# Patient Record
Sex: Male | Born: 1990 | Race: White | Hispanic: No | Marital: Single | Smoking: Never smoker
Health system: Southern US, Community
[De-identification: ages and names within clinical notes are randomized; demographics above are authoritative.]

## PROBLEM LIST (undated history)

## (undated) DIAGNOSIS — M419 Scoliosis, unspecified: Secondary | ICD-10-CM

## (undated) HISTORY — PX: BACK SURGERY: SHX140

## (undated) HISTORY — PX: HAND SURGERY: SHX662

---

## 2018-08-22 DIAGNOSIS — H16041 Marginal corneal ulcer, right eye: Secondary | ICD-10-CM | POA: Diagnosis not present

## 2018-08-25 DIAGNOSIS — H16041 Marginal corneal ulcer, right eye: Secondary | ICD-10-CM | POA: Diagnosis not present

## 2019-01-08 DIAGNOSIS — Z20828 Contact with and (suspected) exposure to other viral communicable diseases: Secondary | ICD-10-CM | POA: Diagnosis not present

## 2019-03-03 ENCOUNTER — Other Ambulatory Visit: Payer: Self-pay

## 2019-03-03 DIAGNOSIS — Z20822 Contact with and (suspected) exposure to covid-19: Secondary | ICD-10-CM

## 2019-03-05 LAB — NOVEL CORONAVIRUS, NAA: SARS-CoV-2, NAA: NOT DETECTED

## 2020-01-12 ENCOUNTER — Other Ambulatory Visit: Payer: Self-pay

## 2020-01-12 ENCOUNTER — Encounter (HOSPITAL_COMMUNITY): Payer: Self-pay

## 2020-01-12 ENCOUNTER — Emergency Department (HOSPITAL_COMMUNITY)
Admission: EM | Admit: 2020-01-12 | Discharge: 2020-01-13 | Disposition: A | Discharge status: Home / Self Care | Payer: BC Managed Care – PPO | Attending: Emergency Medicine | Admitting: Emergency Medicine

## 2020-01-12 DIAGNOSIS — Z20822 Contact with and (suspected) exposure to covid-19: Secondary | ICD-10-CM | POA: Diagnosis not present

## 2020-01-12 DIAGNOSIS — S065X9A Traumatic subdural hemorrhage with loss of consciousness of unspecified duration, initial encounter: Secondary | ICD-10-CM | POA: Insufficient documentation

## 2020-01-12 DIAGNOSIS — W133XXA Fall through floor, initial encounter: Secondary | ICD-10-CM | POA: Diagnosis not present

## 2020-01-12 DIAGNOSIS — S0219XA Other fracture of base of skull, initial encounter for closed fracture: Secondary | ICD-10-CM

## 2020-01-12 DIAGNOSIS — S0990XA Unspecified injury of head, initial encounter: Secondary | ICD-10-CM | POA: Diagnosis present

## 2020-01-12 DIAGNOSIS — S065XAA Traumatic subdural hemorrhage with loss of consciousness status unknown, initial encounter: Secondary | ICD-10-CM

## 2020-01-12 DIAGNOSIS — S0921XA Traumatic rupture of right ear drum, initial encounter: Secondary | ICD-10-CM | POA: Diagnosis not present

## 2020-01-12 DIAGNOSIS — S0281XA Fracture of other specified skull and facial bones, right side, initial encounter for closed fracture: Secondary | ICD-10-CM | POA: Diagnosis not present

## 2020-01-12 DIAGNOSIS — Y9301 Activity, walking, marching and hiking: Secondary | ICD-10-CM | POA: Insufficient documentation

## 2020-01-12 DIAGNOSIS — R112 Nausea with vomiting, unspecified: Secondary | ICD-10-CM | POA: Diagnosis not present

## 2020-01-12 DIAGNOSIS — S06369A Traumatic hemorrhage of cerebrum, unspecified, with loss of consciousness of unspecified duration, initial encounter: Secondary | ICD-10-CM | POA: Diagnosis not present

## 2020-01-12 DIAGNOSIS — G9389 Other specified disorders of brain: Secondary | ICD-10-CM

## 2020-01-12 DIAGNOSIS — Y92090 Kitchen in other non-institutional residence as the place of occurrence of the external cause: Secondary | ICD-10-CM | POA: Insufficient documentation

## 2020-01-12 HISTORY — DX: Scoliosis, unspecified: M41.9

## 2020-01-12 NOTE — ED Notes (Signed)
Andre Newman gf [phone]

## 2020-01-12 NOTE — ED Triage Notes (Signed)
Pt coming with EMS from home where it was reported that pt was watching tv, had sudden onset nausea and not feeling well. Pt walked to kitchen, vomited, and fell to the floor. Not witnessed; pt loss consciousness and gf states pt was confused. Pt was found to be bleeding from RT ear with clear liquid in a halo around pool of blood. Pt has -spinal tenderness; in c-collar for safety. Pt has ETOH on board.

## 2020-01-13 ENCOUNTER — Telehealth (HOSPITAL_COMMUNITY): Payer: Self-pay | Admitting: Emergency Medicine

## 2020-01-13 ENCOUNTER — Emergency Department (HOSPITAL_COMMUNITY): Payer: BC Managed Care – PPO

## 2020-01-13 LAB — CBC WITH DIFFERENTIAL/PLATELET
Abs Immature Granulocytes: 0.04 10*3/uL (ref 0.00–0.07)
Basophils Absolute: 0 10*3/uL (ref 0.0–0.1)
Basophils Relative: 1 %
Eosinophils Absolute: 0.3 10*3/uL (ref 0.0–0.5)
Eosinophils Relative: 5 %
HCT: 42.9 % (ref 39.0–52.0)
Hemoglobin: 14.8 g/dL (ref 13.0–17.0)
Immature Granulocytes: 1 %
Lymphocytes Relative: 35 %
Lymphs Abs: 2.1 10*3/uL (ref 0.7–4.0)
MCH: 32.2 pg (ref 26.0–34.0)
MCHC: 34.5 g/dL (ref 30.0–36.0)
MCV: 93.3 fL (ref 80.0–100.0)
Monocytes Absolute: 0.6 10*3/uL (ref 0.1–1.0)
Monocytes Relative: 10 %
Neutro Abs: 2.9 10*3/uL (ref 1.7–7.7)
Neutrophils Relative %: 48 %
Platelets: 239 10*3/uL (ref 150–400)
RBC: 4.6 MIL/uL (ref 4.22–5.81)
RDW: 11.8 % (ref 11.5–15.5)
WBC: 6 10*3/uL (ref 4.0–10.5)
nRBC: 0 % (ref 0.0–0.2)

## 2020-01-13 LAB — RESPIRATORY PANEL BY RT PCR (FLU A&B, COVID)
Influenza A by PCR: NEGATIVE
Influenza B by PCR: NEGATIVE
SARS Coronavirus 2 by RT PCR: NEGATIVE

## 2020-01-13 LAB — BASIC METABOLIC PANEL
Anion gap: 13 (ref 5–15)
BUN: 18 mg/dL (ref 6–20)
CO2: 23 mmol/L (ref 22–32)
Calcium: 9 mg/dL (ref 8.9–10.3)
Chloride: 105 mmol/L (ref 98–111)
Creatinine, Ser: 0.96 mg/dL (ref 0.61–1.24)
GFR calc Af Amer: >60 mL/min (ref >60)
GFR calc non Af Amer: >60 mL/min (ref >60)
Glucose, Bld: 106 mg/dL — ABNORMAL HIGH (ref 70–99)
Potassium: 3.3 mmol/L — ABNORMAL LOW (ref 3.5–5.1)
Sodium: 141 mmol/L (ref 135–145)

## 2020-01-13 MED ORDER — HYDROMORPHONE HCL 1 MG/ML IJ SOLN
0.5000 mg | Freq: Once | INTRAMUSCULAR | Status: AC
  Administered 2020-01-13: 0.5 mg via INTRAVENOUS
  Filled 2020-01-13: qty 1

## 2020-01-13 MED ORDER — ONDANSETRON 4 MG PO TBDP: 4.0000 mg | ORAL_TABLET | Freq: Three times a day (TID) | ORAL | 0 refills | Status: DC | PRN

## 2020-01-13 MED ORDER — CEFAZOLIN SODIUM-DEXTROSE 1-4 GM/50ML-% IV SOLN
1.0000 g | Freq: Once | INTRAVENOUS | Status: AC
  Administered 2020-01-13: 1 g via INTRAVENOUS
  Filled 2020-01-13: qty 50

## 2020-01-13 MED ORDER — ACETAMINOPHEN 500 MG PO TABS: 1000.0000 mg | ORAL_TABLET | Freq: Three times a day (TID) | ORAL | 0 refills | Status: DC

## 2020-01-13 MED ORDER — SODIUM CHLORIDE 0.9 % IV BOLUS (SEPSIS)
1000.0000 mL | Freq: Once | INTRAVENOUS | Status: AC
  Administered 2020-01-13: 1000 mL via INTRAVENOUS

## 2020-01-13 MED ORDER — ONDANSETRON 4 MG PO TBDP: 4.0000 mg | ORAL_TABLET | Freq: Three times a day (TID) | ORAL | 0 refills | Status: AC | PRN

## 2020-01-13 MED ORDER — IOHEXOL 350 MG/ML SOLN
100.0000 mL | Freq: Once | INTRAVENOUS | Status: AC | PRN
  Administered 2020-01-13: 100 mL via INTRAVENOUS

## 2020-01-13 MED ORDER — HYDROCODONE-ACETAMINOPHEN 5-325 MG PO TABS: 1.0000 | ORAL_TABLET | Freq: Three times a day (TID) | ORAL | 0 refills | Status: DC | PRN

## 2020-01-13 MED ORDER — SODIUM CHLORIDE 0.9 % IV SOLN
1000.0000 mL | INTRAVENOUS | Status: DC
  Administered 2020-01-13: 1000 mL via INTRAVENOUS

## 2020-01-13 MED ORDER — ACETAMINOPHEN 500 MG PO TABS: 1000.0000 mg | ORAL_TABLET | Freq: Three times a day (TID) | ORAL | 0 refills | Status: AC

## 2020-01-13 NOTE — ED Provider Notes (Signed)
[facility] Provider Note  CSN: [id number] Arrival date & time: 01/12/20 [id number]  Chief Complaint(s) Fall  HPI Andre Newman is a 29 y.o. male who presents to the emergency department after fall while intoxicated resulting in head trauma.  Patient does not remember the cause of the fall.  Bystanders reported to EMS that patient reported feeling suddenly nauseous and as he was walking to the kitchen, he vomited and fell to the floor.  The fall was not witnessed.  There was positive loss of consciousness.  Patient was reportedly confused after the fall.  Found to be bleeding from the right ear.  Patient endorses drinking whiskey with friends today.  Denies daily alcohol consumption.  He is alert and oriented x3.  Denies any headache, neck pain, back pain, chest pain, abdominal pain, extremity pain.  HPI  Past Medical History Past Medical History:  Diagnosis Date  . Scoliosis    There are no problems to display for this patient.  Home Medication(s) Prior to Admission medications   Not on File                                                                                                                                    Past Surgical History Past Surgical History:  Procedure Laterality Date  . BACK SURGERY    . HAND SURGERY Bilateral    Family History History reviewed. No pertinent family history.  Social History Social History   Tobacco Use  . Smoking status: Never Smoker  Substance Use Topics  . Alcohol use: Yes  . Drug use: Never   Allergies Nickel  Review of Systems Review of Systems All other systems are reviewed and are negative for acute change except as noted in the HPI  Physical Exam Vital Signs  I have reviewed the triage vital signs BP (!) 146/93 (BP Location: Right Arm)   Pulse 86   Temp (!) 97.5 F (36.4 C) (Oral)   Resp (!) 27   Ht 5\' 7"  (1.702 m)   Wt 74.8 kg   SpO2 99%   BMI 25.84 kg/m   Physical  Exam Constitutional:      General: He is not in acute distress.    Appearance: He is well-developed. He is not diaphoretic.  HENT:     Head: Normocephalic.     Right Ear: Hearing normal.     Left Ear: Hearing and external ear normal.     Ears:     Comments: Blood in the right external ear canal with swelling and likely hematoma at 6-8 o'clock area. TM appears irregular, possible rupture. No hemotympanum.  Left TM has webbed pattern, but appears intact. No bleeding Eyes:     General: No scleral icterus.       Right eye: No discharge.        Left eye: No discharge.     Conjunctiva/sclera: Conjunctivae normal.  Pupils: Pupils are equal, round, and reactive to light.  Cardiovascular:     Rate and Rhythm: Regular rhythm.     Pulses:          Radial pulses are 2+ on the right side and 2+ on the left side.       Dorsalis pedis pulses are 2+ on the right side and 2+ on the left side.     Heart sounds: Normal heart sounds. No murmur heard.  No friction rub. No gallop.   Pulmonary:     Effort: Pulmonary effort is normal. No respiratory distress.     Breath sounds: Normal breath sounds. No stridor.  Abdominal:     General: There is no distension.     Palpations: Abdomen is soft.     Tenderness: There is no abdominal tenderness.  Musculoskeletal:     Cervical back: Normal range of motion and neck supple. No bony tenderness. No spinous process tenderness or muscular tenderness.     Thoracic back: No bony tenderness.     Lumbar back: No bony tenderness.     Comments: Clavicle stable. Chest stable to AP/Lat compression. Pelvis stable to Lat compression. Congenital ectrodactyly No chest or abdominal wall contusion.  Skin:    General: Skin is warm.  Neurological:     Mental Status: He is alert and oriented to person, place, and time.     GCS: GCS eye subscore is 4. GCS verbal subscore is 5. GCS motor subscore is 6.     Comments: Moving all extremities      ED Results and  Treatments Labs (all labs ordered are listed, but only abnormal results are displayed) Labs Reviewed  BASIC METABOLIC PANEL - Abnormal; Notable for the following components:      Result Value   Potassium 3.3 (*)    Glucose, Bld 106 (*)    All other components within normal limits  RESPIRATORY PANEL BY RT PCR (FLU A&B, COVID)  CBC WITH DIFFERENTIAL/PLATELET                                                                                                                         EKG  EKG Interpretation  Date/Time:    Ventricular Rate:    PR Interval:    QRS Duration:   QT Interval:    QTC Calculation:   R Axis:     Text Interpretation:        Radiology CT Angio Head W or Wo Contrast  Result Date: 01/13/2020 CLINICAL DATA:  Skull base fracture EXAM: CT ANGIOGRAPHY HEAD AND NECK TECHNIQUE: Multidetector CT imaging of the head and neck was performed using the standard protocol during bolus administration of intravenous contrast. Multiplanar CT image reconstructions and MIPs were obtained to evaluate the vascular anatomy. Carotid stenosis measurements (when applicable) are obtained utilizing NASCET criteria, using the distal internal carotid diameter as the denominator. CONTRAST:  OMNIPAQUE IOHEXOL 350 MG/ML SOLN COMPARISON:  None. FINDINGS: CTA NECK FINDINGS  SKELETON: Right occipital and temporal bone fractures. OTHER NECK: Normal pharynx, larynx and major salivary glands. No cervical lymphadenopathy. Unremarkable thyroid gland. UPPER CHEST: No pneumothorax or pleural effusion. No nodules or masses. AORTIC ARCH: There is no calcific atherosclerosis of the aortic arch. There is no aneurysm, dissection or hemodynamically significant stenosis of the visualized portion of the aorta. Conventional 3 vessel aortic branching pattern. The visualized proximal subclavian arteries are widely patent. RIGHT CAROTID SYSTEM: Normal without aneurysm, dissection or stenosis. LEFT CAROTID SYSTEM: Normal  without aneurysm, dissection or stenosis. VERTEBRAL ARTERIES: Codominant configuration. Both origins are clearly patent. There is no dissection, occlusion or flow-limiting stenosis to the skull base (V1-V3 segments). CTA HEAD FINDINGS POSTERIOR CIRCULATION: --Vertebral arteries: Normal V4 segments. --Inferior cerebellar arteries: Normal. --Basilar artery: Normal. --Superior cerebellar arteries: Normal. --Posterior cerebral arteries (PCA): Normal. ANTERIOR CIRCULATION: --Intracranial internal carotid arteries: Normal. --Anterior cerebral arteries (ACA): Normal. Both A1 segments are present. Patent anterior communicating artery (a-comm). --Middle cerebral arteries (MCA): Normal. VENOUS SINUSES: As permitted by contrast timing, patent. ANATOMIC VARIANTS: None Review of the MIP images confirms the above findings. IMPRESSION: No blunt cerebrovascular injury. The right internal carotid artery and right internal jugular vein are patent and of consistent caliber at the site of the right temporal bone fracture. Electronically Signed   By: Deatra Robinson M.D.   On: 01/13/2020 02:14   CT Head Wo Contrast  Result Date: 01/13/2020 CLINICAL DATA:  Head trauma. EXAM: CT HEAD AND TEMPORAL BONES WITHOUT CONTRAST TECHNIQUE: Contiguous axial images were obtained from the base of the skull through the vertex without contrast. Multidetector CT imaging of the temporal bones was performed using the standard protocol without intravenous contrast. COMPARISON:  None. FINDINGS: CT HEAD FINDINGS Brain: Small volume pneumocephalus, most notable along the anterior interhemispheric fissure. There is a small parafalcine subdural hematoma. Small amount of subarachnoid blood at the anterior right convexity. There is also a small subdural hematoma at the lateral right convexity. No midline shift or other mass effect. Vascular: No abnormal hyperdensity of the major intracranial arteries or dural venous sinuses. No intracranial atherosclerosis.  Skull: There is a right paramedian occipital bone fracture that extends to the right petrous apex, detailed below. Sinuses/Orbits: No fluid levels or advanced mucosal thickening of the visualized paranasal sinuses. The orbits are normal. CT TEMPORAL BONES FINDINGS There is blood in the middle ear cavity. No ossicular dislocation. There is a fracture of the right temporal bone that passes medial to the internal auditory canal and cochlea. The fracture traverses the carotid canal, jugular foramen and hypoglossal canal. The mastoid air cells are clear. IMPRESSION: 1. Small parafalcine and lateral right convexity subdural hematomas without midline shift or other mass effect. 2. Right temporal bone fracture that spares the otic capsule, but traverses the carotid canal, jugular foramen and hypoglossal canal. CTA of the head is recommended to assess vascular patency. 3. Small volume pneumocephalus, most notable along the anterior interhemispheric fissure. Critical Value/emergent results were called by telephone at the time of interpretation on 01/13/2020 at 1:23 am to provider [facility] , who verbally acknowledged these results. Electronically Signed   By: Deatra Robinson M.D.   On: 01/13/2020 01:22   CT Angio Neck W and/or Wo Contrast  Result Date: 01/13/2020 CLINICAL DATA:  Skull base fracture EXAM: CT ANGIOGRAPHY HEAD AND NECK TECHNIQUE: Multidetector CT imaging of the head and neck was performed using the standard protocol during bolus administration of intravenous contrast. Multiplanar CT image reconstructions and MIPs were obtained to  evaluate the vascular anatomy. Carotid stenosis measurements (when applicable) are obtained utilizing NASCET criteria, using the distal internal carotid diameter as the denominator. CONTRAST:  OMNIPAQUE IOHEXOL 350 MG/ML SOLN COMPARISON:  None. FINDINGS: CTA NECK FINDINGS SKELETON: Right occipital and temporal bone fractures. OTHER NECK: Normal pharynx, larynx and major  salivary glands. No cervical lymphadenopathy. Unremarkable thyroid gland. UPPER CHEST: No pneumothorax or pleural effusion. No nodules or masses. AORTIC ARCH: There is no calcific atherosclerosis of the aortic arch. There is no aneurysm, dissection or hemodynamically significant stenosis of the visualized portion of the aorta. Conventional 3 vessel aortic branching pattern. The visualized proximal subclavian arteries are widely patent. RIGHT CAROTID SYSTEM: Normal without aneurysm, dissection or stenosis. LEFT CAROTID SYSTEM: Normal without aneurysm, dissection or stenosis. VERTEBRAL ARTERIES: Codominant configuration. Both origins are clearly patent. There is no dissection, occlusion or flow-limiting stenosis to the skull base (V1-V3 segments). CTA HEAD FINDINGS POSTERIOR CIRCULATION: --Vertebral arteries: Normal V4 segments. --Inferior cerebellar arteries: Normal. --Basilar artery: Normal. --Superior cerebellar arteries: Normal. --Posterior cerebral arteries (PCA): Normal. ANTERIOR CIRCULATION: --Intracranial internal carotid arteries: Normal. --Anterior cerebral arteries (ACA): Normal. Both A1 segments are present. Patent anterior communicating artery (a-comm). --Middle cerebral arteries (MCA): Normal. VENOUS SINUSES: As permitted by contrast timing, patent. ANATOMIC VARIANTS: None Review of the MIP images confirms the above findings. IMPRESSION: No blunt cerebrovascular injury. The right internal carotid artery and right internal jugular vein are patent and of consistent caliber at the site of the right temporal bone fracture. Electronically Signed   By: Deatra Robinson M.D.   On: 01/13/2020 02:14   CT Cervical Spine Wo Contrast  Result Date: 01/13/2020 CLINICAL DATA:  Neck trauma, to EXAM: CT CERVICAL SPINE WITHOUT CONTRAST TECHNIQUE: Multidetector CT imaging of the cervical spine was performed without intravenous contrast. Multiplanar CT image reconstructions were also generated. COMPARISON:  None. FINDINGS:  Alignment: Physiologic Skull base and vertebrae: There is a fracture seen through the right occipital bone extending through the occipital condyle and temporal bone as further described dedicated CT. No atlanto-occipital dissociation. The vertebral body heights are well maintained. No fracture or pathologic osseous lesion seen. Soft tissues and spinal canal: The visualized paraspinal soft tissues are unremarkable. No prevertebral soft tissue swelling is seen. The spinal canal is grossly unremarkable, no large epidural collection or significant canal narrowing. Disc levels:    No significant canal or neural foraminal narrowing. Upper chest: The lung apices are clear. Thoracic inlet is within normal limits. Other: None IMPRESSION: Nondisplaced fractures through the right nasal bone and temporal bone and further described on dedicated CT. No acute fracture or malalignment of the cervical spine. Electronically Signed   By: Jonna Clark M.D.   On: 01/13/2020 01:15   CT Temporal Bones Wo Contrast  Result Date: 01/13/2020 CLINICAL DATA:  Head trauma. EXAM: CT HEAD AND TEMPORAL BONES WITHOUT CONTRAST TECHNIQUE: Contiguous axial images were obtained from the base of the skull through the vertex without contrast. Multidetector CT imaging of the temporal bones was performed using the standard protocol without intravenous contrast. COMPARISON:  None. FINDINGS: CT HEAD FINDINGS Brain: Small volume pneumocephalus, most notable along the anterior interhemispheric fissure. There is a small parafalcine subdural hematoma. Small amount of subarachnoid blood at the anterior right convexity. There is also a small subdural hematoma at the lateral right convexity. No midline shift or other mass effect. Vascular: No abnormal hyperdensity of the major intracranial arteries or dural venous sinuses. No intracranial atherosclerosis. Skull: There is a right paramedian occipital bone  fracture that extends to the right petrous apex, detailed  below. Sinuses/Orbits: No fluid levels or advanced mucosal thickening of the visualized paranasal sinuses. The orbits are normal. CT TEMPORAL BONES FINDINGS There is blood in the middle ear cavity. No ossicular dislocation. There is a fracture of the right temporal bone that passes medial to the internal auditory canal and cochlea. The fracture traverses the carotid canal, jugular foramen and hypoglossal canal. The mastoid air cells are clear. IMPRESSION: 1. Small parafalcine and lateral right convexity subdural hematomas without midline shift or other mass effect. 2. Right temporal bone fracture that spares the otic capsule, but traverses the carotid canal, jugular foramen and hypoglossal canal. CTA of the head is recommended to assess vascular patency. 3. Small volume pneumocephalus, most notable along the anterior interhemispheric fissure. Critical Value/emergent results were called by telephone at the time of interpretation on 01/13/2020 at 1:23 am to provider [facility] , who verbally acknowledged these results. Electronically Signed   By: Deatra Robinson M.D.   On: 01/13/2020 01:22    Pertinent labs & imaging results that were available during my care of the patient were reviewed by me and considered in my medical decision making (see chart for details).  Medications Ordered in ED Medications  sodium chloride 0.9 % bolus 1,000 mL (0 mLs Intravenous Stopped 01/13/20 0116)    Followed by  0.9 %  sodium chloride infusion (1,000 mLs Intravenous New Bag/Given 01/13/20 0221)  ceFAZolin (ANCEF) IVPB 1 g/50 mL premix (0 g Intravenous Stopped 01/13/20 0228)  HYDROmorphone (DILAUDID) injection 0.5 mg (0.5 mg Intravenous Given 01/13/20 0154)  iohexol (OMNIPAQUE) 350 MG/ML injection 100 mL (100 mLs Intravenous Contrast Given 01/13/20 0153)                                                                                                                                    Procedures .Critical Care Performed by:  Nira Conn, MD Authorized by: Nira Conn, MD    CRITICAL CARE Performed by: Amadeo Garnet Lamont Glasscock Total critical care time: 60 minutes Critical care time was exclusive of separately billable procedures and treating other patients. Critical care was necessary to treat or prevent imminent or life-threatening deterioration. Critical care was time spent personally by me on the following activities: development of treatment plan with patient and/or surrogate as well as nursing, discussions with consultants, evaluation of patient's response to treatment, examination of patient, obtaining history from patient or surrogate, ordering and performing treatments and interventions, ordering and review of laboratory studies, ordering and review of radiographic studies, pulse oximetry and re-evaluation of patient's condition.   (including critical care time)  Medical Decision Making / ED Course I have reviewed the nursing notes for this encounter and the patient's prior records (if available in EHR or on provided paperwork).   JULLIEN GRANQUIST was evaluated in Emergency Department on 01/13/2020 for the symptoms described in the history of  present illness. He was evaluated in the context of the global COVID-19 pandemic, which necessitated consideration that the patient might be at risk for infection with the SARS-CoV-2 virus that causes COVID-19. Institutional protocols and algorithms that pertain to the evaluation of patients at risk for COVID-19 are in a state of rapid change based on information released by regulatory bodies including the CDC and federal and state organizations. These policies and algorithms were followed during the patient's care in the ED.  Fall at home in the setting of alcohol intoxication.  Patient fell after throwing up.  At time of evaluation the patient is alert and oriented x4.  No focal deficits on exam.  There was suspicion for possible CSF leak reported by  EMS.  No CSF ring noted on my evaluation.  Possible TM perforation on exam  CT of the head, cervical spine and temporal bone obtained revealing 2 small subdural hematomas with small pneumocephalus and a right temporal bone fracture.  Case discussed with neurosurgery who recommended repeat imaging in a few hours.  If stable, patient can be discharged with close follow-up.  Also spoke to Dr. Jearld Fenton from ENT regarding the ear trauma.  He recommended close follow-up in clinic.  No otic or oral antibiotics per ENT.  Keep the ear dry.  Repeat CT stable and reassuring.      Final Clinical Impression(s) / ED Diagnoses Final diagnoses:  SDH (subdural hematoma) (HCC)  Pneumocephalus, traumatic  Closed fracture of temporal bone, initial encounter (HCC)  Traumatic rupture of right ear drum, initial encounter  Injury of head, initial encounter   The patient appears reasonably screened and/or stabilized for discharge and I doubt any other medical condition or other [facility] requiring further screening, evaluation, or treatment in the ED at this time prior to discharge. Safe for discharge with strict return precautions.  Disposition: Discharge  Condition: Good  I have discussed the results, Dx and Tx plan with the patient/family who expressed understanding and agree(s) with the plan. Discharge instructions discussed at length. The patient/family was given strict return precautions who verbalized understanding of the instructions. No further questions at time of discharge.    ED Discharge Orders         Ordered    acetaminophen (TYLENOL) 500 MG tablet  Every 8 hours        01/13/20 0656    HYDROcodone-acetaminophen (NORCO/VICODIN) 5-325 MG tablet  Every 8 hours PRN        01/13/20 0656    ondansetron (ZOFRAN ODT) 4 MG disintegrating tablet  Every 8 hours PRN        01/13/20 0656          [facility] narcotic database reviewed and no active narcotic prescriptions noted.   Follow  Up: Lisbeth Renshaw, MD [address] [place] [address] [phone]  Call  To schedule an appointment for close follow up  Suzanna Obey, MD [address] [place] [address] [phone] or [phone] Call  To schedule an appointment for close follow up  Rosalio Macadamia, MD [address] [place] [address] [place] [address] [phone]  Call  As needed      This chart was dictated using voice recognition software.  Despite best efforts to proofread,  errors can occur which can change the documentation meaning.   Nira Conn, MD 01/13/20 [phone]

## 2020-01-13 NOTE — Progress Notes (Signed)
Neurosurgery progress note    Received called from Dr Eudelia Bunch, EDP [facility], regarding patient.  29 year old male who fell several hours ago. By report, felt nauseated and went to vomit when he fell, etoh on board. Underwent Work up by EDP and found to have a nondisplaced temporal bone fracture as well as right convexity and parafalcine SDHs (no mass effect, MLS). He is Neuro intact. There is no role for neurosurgical intervention. P lan to repeat head CT in several hours. If unchanged, patient can be discharged home.

## 2020-01-13 NOTE — ED Notes (Signed)
Replaced pt C-collar from field to [facility] collar per provider verbal order.

## 2020-01-13 NOTE — Telephone Encounter (Signed)
Patient called and states that pharmacy did not receive medications.  Medications were resent.

## 2020-01-15 ENCOUNTER — Telehealth: Payer: Self-pay | Admitting: Surgery

## 2020-01-15 NOTE — Telephone Encounter (Signed)
ED RNCM received call from patient and wife concerning the pharmacy not receiving patient's prescription for Vicodin, CM reviewed record and called [facility] on [address] they also confirmed they did not received the prescription, CM sent message to EDP and will follow-up tomorrow 10/6.

## 2020-01-16 MED ORDER — HYDROCODONE-ACETAMINOPHEN 5-325 MG PO TABS
1.0000 | ORAL_TABLET | Freq: Three times a day (TID) | ORAL | 0 refills | Status: DC | PRN
Start: 2020-01-16 — End: 2020-01-16

## 2020-01-16 NOTE — Telephone Encounter (Signed)
Was unable to order the Norco through Epic. There was a message regarding the need for additional provider information including address, department, etc. There was no place to enter this information, thus the Rx could not be e-Rx'd.  7:42 AM I called the [facility] on [address] and left a verbal Rx for this medication with administration instruction.  Please have the patient call the pharmacy when it opens to see of the Rx went through.

## 2022-05-08 IMAGING — CT CT TEMPORAL BONES W/O CM
3 of 8 series · 15 of 40 positions shown, 17 images · non-contrast
Comparison: None.

CLINICAL DATA: Head trauma.

EXAM:
CT HEAD AND TEMPORAL BONES WITHOUT CONTRAST
TECHNIQUE: Contiguous axial images were obtained from the base of the skull
through the vertex without contrast. Multidetector CT imaging of the
temporal bones was performed using the standard protocol without
intravenous contrast.

[Series 5: axial mag right · axial · 0.18mm/px · z∈[-180,-102]mm · 7 of 175 slices shown, 9 images]
[im 22/175  brain]
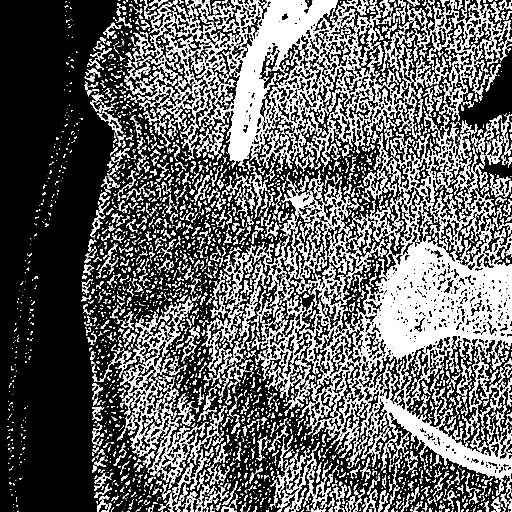
[im 22/175  bone]
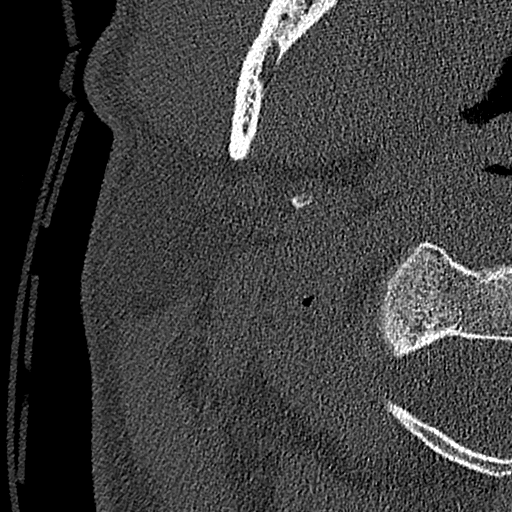
[im 44/175  bone]
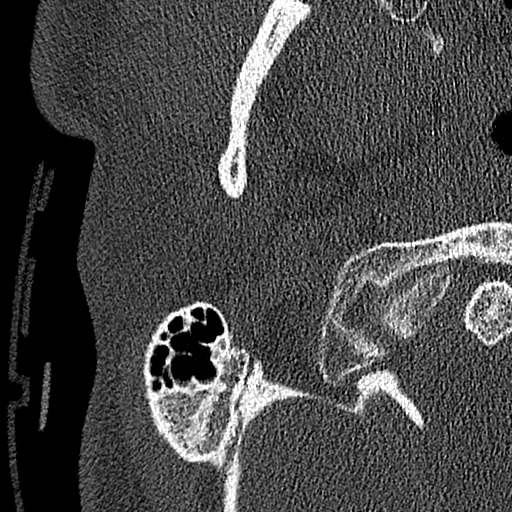
[im 66/175  bone]
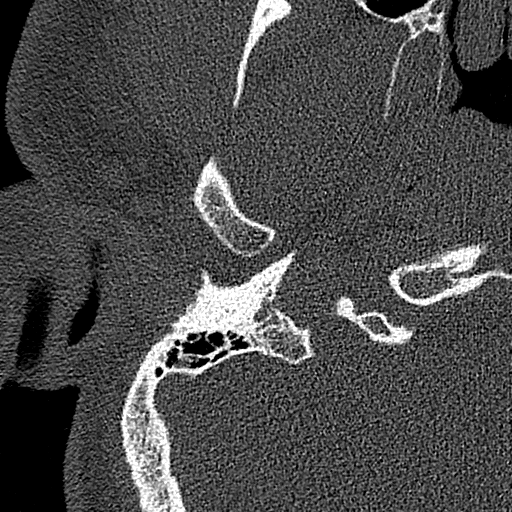
[im 88/175  bone]
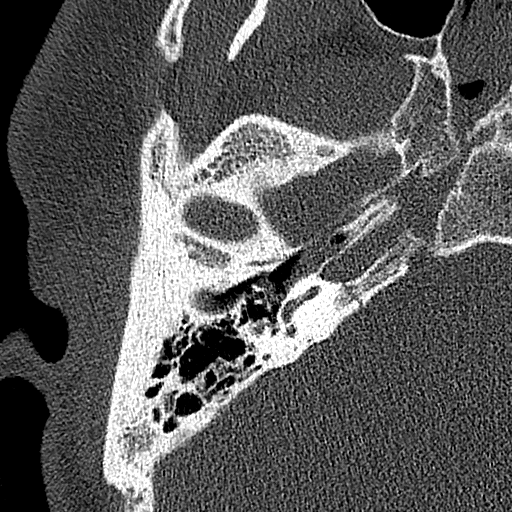
[im 109/175  brain]
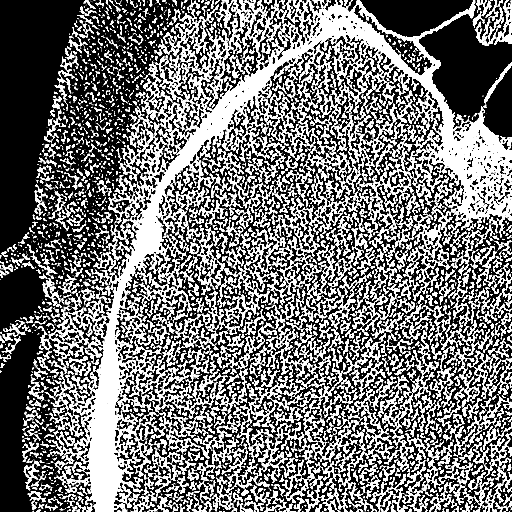
[im 109/175  bone]
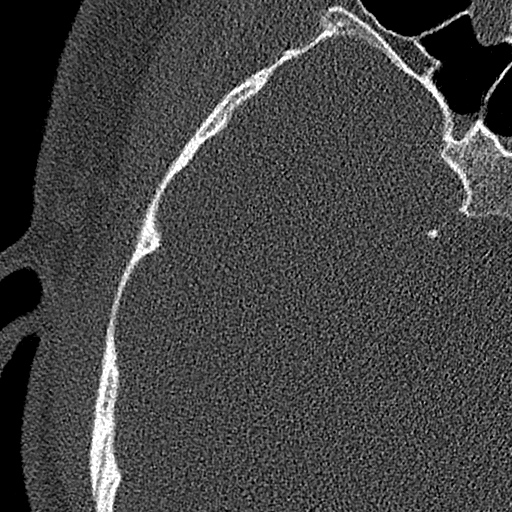
[im 131/175  bone]
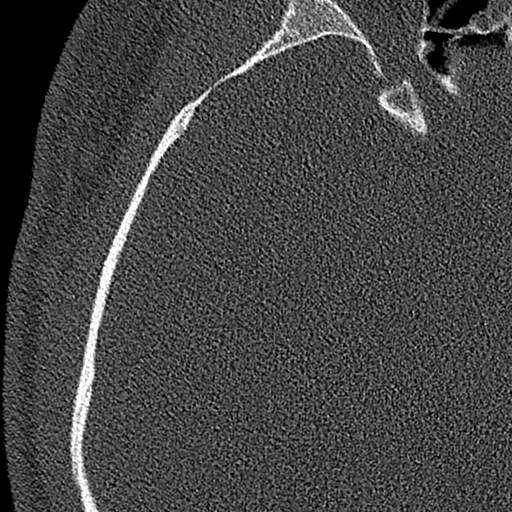
[im 153/175  bone]
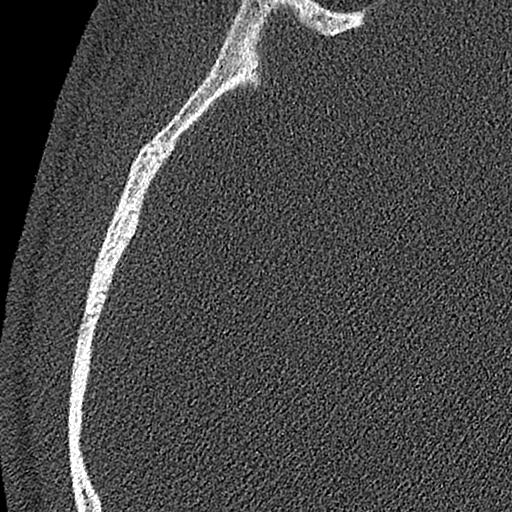

[Series 6: axial mag left · axial · 0.18mm/px · z∈[-180,-102]mm · 7 of 175 slices shown]
[im 22/175  bone]
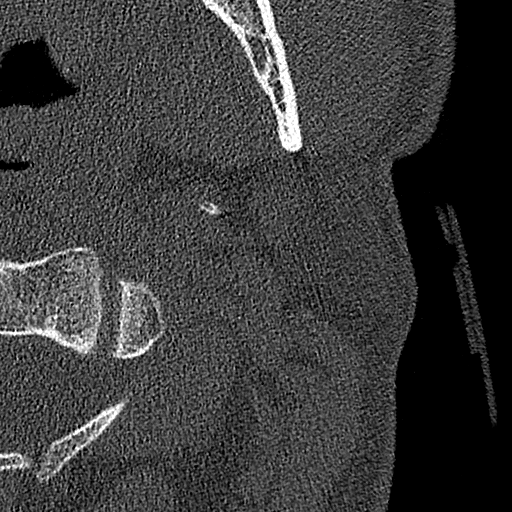
[im 44/175  bone]
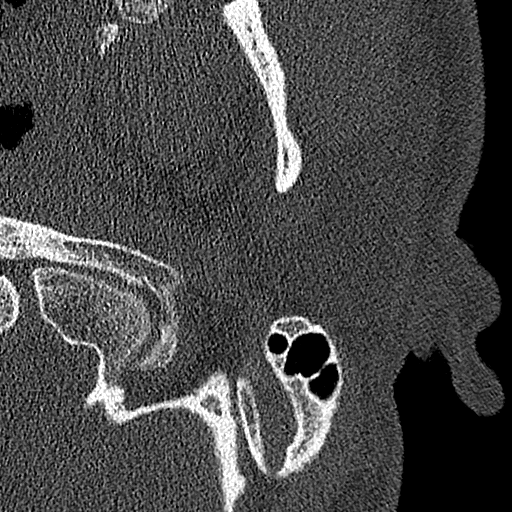
[im 66/175  bone]
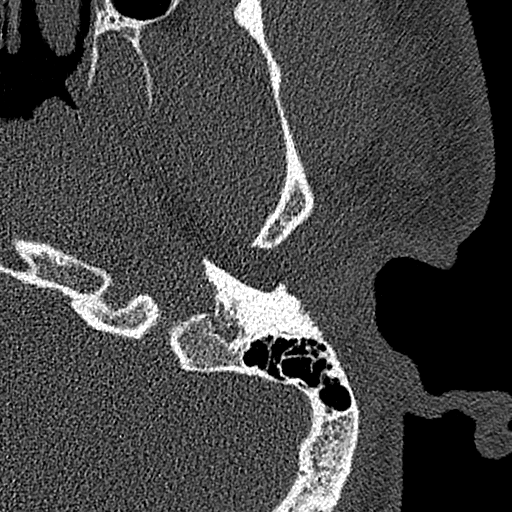
[im 88/175  bone]
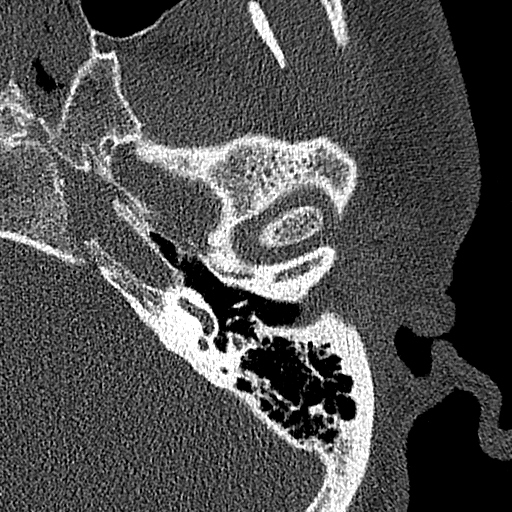
[im 109/175  bone]
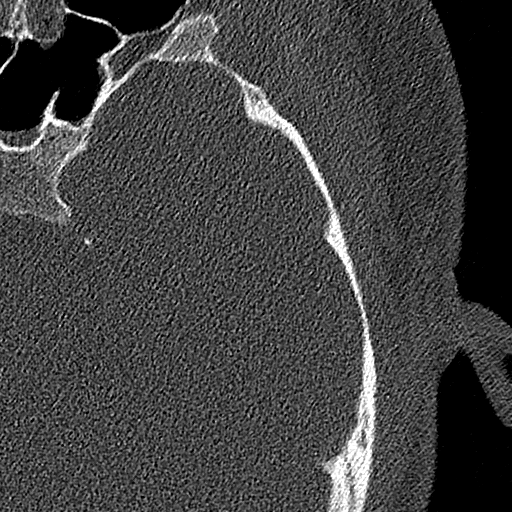
[im 131/175  bone]
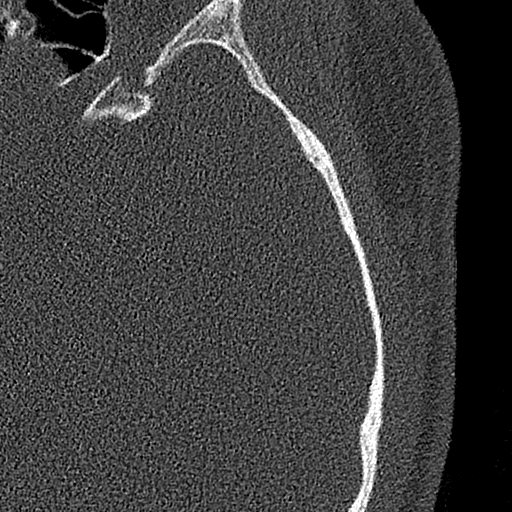
[im 153/175  bone]
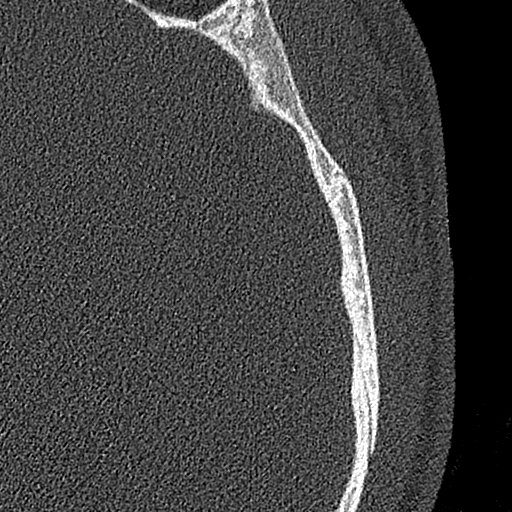

[Series 9: full fov cor bone · coronal · 0.25mm/px · 1 of 301 slices shown]
[im 151/301  bone]
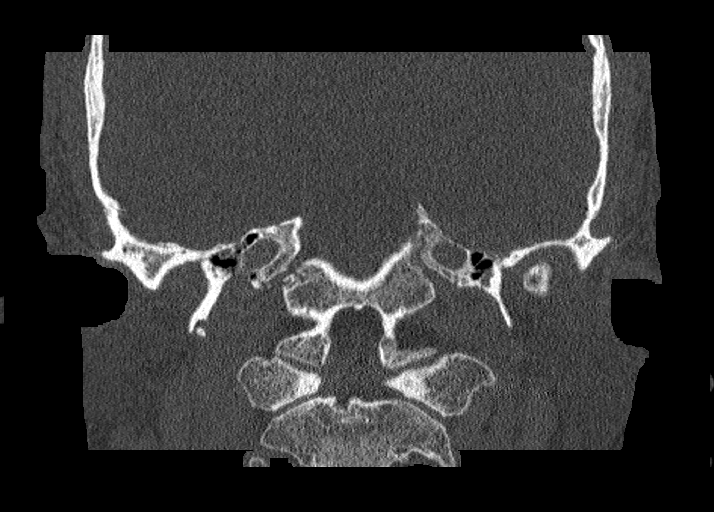

[15 of 40 positions shown; findings below may reference images not displayed]

FINDINGS: CT HEAD FINDINGS

Brain: Small volume pneumocephalus, most notable along the anterior
interhemispheric fissure. There is a small parafalcine subdural
hematoma. Small amount of subarachnoid blood at the anterior right
convexity. There is also a small subdural hematoma at the lateral
right convexity. No midline shift or other mass effect.

Vascular: No abnormal hyperdensity of the major intracranial
arteries or dural venous sinuses. No intracranial atherosclerosis.

Skull: There is a right paramedian occipital bone fracture that
extends to the right petrous apex, detailed below.

Sinuses/Orbits: No fluid levels or advanced mucosal thickening of
the visualized paranasal sinuses. The orbits are normal.

CT TEMPORAL BONES FINDINGS

There is blood in the middle ear cavity. No ossicular dislocation.
There is a fracture of the right temporal bone that passes medial to
the internal auditory canal and cochlea. The fracture traverses the
carotid canal, jugular foramen and hypoglossal canal. The mastoid
air cells are clear.
IMPRESSION: 1. Small parafalcine and lateral right convexity subdural hematomas
without midline shift or other mass effect.
2. Right temporal bone fracture that spares the otic capsule, but
traverses the carotid canal, jugular foramen and hypoglossal canal.
CTA of the head is recommended to assess vascular patency.
3. Small volume pneumocephalus, most notable along the anterior
interhemispheric fissure.

Critical Value/emergent results were called by telephone at the time
of interpretation on 01/13/2020 at [DATE] to provider CAPASSOLA VILOLA
, who verbally acknowledged these results.
